# Patient Record
Sex: Male | Born: 1990 | Race: White | Hispanic: No | Marital: Married | Smoking: Never smoker
Health system: Southern US, Community
[De-identification: ages and names within clinical notes are randomized; demographics above are authoritative.]

## PROBLEM LIST (undated history)

## (undated) DIAGNOSIS — F909 Attention-deficit hyperactivity disorder, unspecified type: Secondary | ICD-10-CM

---

## 2021-01-02 ENCOUNTER — Emergency Department (HOSPITAL_BASED_OUTPATIENT_CLINIC_OR_DEPARTMENT_OTHER): Payer: BLUE CROSS/BLUE SHIELD

## 2021-01-02 ENCOUNTER — Emergency Department (HOSPITAL_BASED_OUTPATIENT_CLINIC_OR_DEPARTMENT_OTHER)
Admission: EM | Admit: 2021-01-02 | Discharge: 2021-01-02 | Disposition: A | Discharge status: Home / Self Care | Payer: BLUE CROSS/BLUE SHIELD | Attending: Emergency Medicine | Admitting: Emergency Medicine

## 2021-01-02 ENCOUNTER — Encounter (HOSPITAL_BASED_OUTPATIENT_CLINIC_OR_DEPARTMENT_OTHER): Payer: Self-pay | Admitting: *Deleted

## 2021-01-02 ENCOUNTER — Other Ambulatory Visit: Payer: Self-pay

## 2021-01-02 DIAGNOSIS — R202 Paresthesia of skin: Secondary | ICD-10-CM | POA: Diagnosis not present

## 2021-01-02 DIAGNOSIS — R079 Chest pain, unspecified: Secondary | ICD-10-CM | POA: Insufficient documentation

## 2021-01-02 DIAGNOSIS — M542 Cervicalgia: Secondary | ICD-10-CM | POA: Diagnosis not present

## 2021-01-02 DIAGNOSIS — R110 Nausea: Secondary | ICD-10-CM | POA: Diagnosis not present

## 2021-01-02 DIAGNOSIS — R42 Dizziness and giddiness: Secondary | ICD-10-CM | POA: Insufficient documentation

## 2021-01-02 DIAGNOSIS — R531 Weakness: Secondary | ICD-10-CM | POA: Insufficient documentation

## 2021-01-02 HISTORY — DX: Attention-deficit hyperactivity disorder, unspecified type: F90.9

## 2021-01-02 LAB — CBC
HCT: 44.4 % (ref 39.0–52.0)
Hemoglobin: 15.6 g/dL (ref 13.0–17.0)
MCH: 32.5 pg (ref 26.0–34.0)
MCHC: 35.1 g/dL (ref 30.0–36.0)
MCV: 92.5 fL (ref 80.0–100.0)
Platelets: 196 10*3/uL (ref 150–400)
RBC: 4.8 MIL/uL (ref 4.22–5.81)
RDW: 12.5 % (ref 11.5–15.5)
WBC: 7.4 10*3/uL (ref 4.0–10.5)
nRBC: 0 % (ref 0.0–0.2)

## 2021-01-02 LAB — BASIC METABOLIC PANEL
Anion gap: 9 (ref 5–15)
BUN: 9 mg/dL (ref 6–20)
CO2: 25 mmol/L (ref 22–32)
Calcium: 9.2 mg/dL (ref 8.9–10.3)
Chloride: 102 mmol/L (ref 98–111)
Creatinine, Ser: 0.85 mg/dL (ref 0.61–1.24)
GFR, Estimated: >60 mL/min (ref >60)
Glucose, Bld: 113 mg/dL — ABNORMAL HIGH (ref 70–99)
Potassium: 3.7 mmol/L (ref 3.5–5.1)
Sodium: 136 mmol/L (ref 135–145)

## 2021-01-02 LAB — TROPONIN I (HIGH SENSITIVITY): Troponin I (High Sensitivity): <2 ng/L (ref <18)

## 2021-01-02 NOTE — ED Notes (Signed)
Pt request removal of PIV, Dr Particia Nearing at bedside, discussed need to re-insert IV if intervention necessary once testing complete.

## 2021-01-02 NOTE — ED Provider Notes (Signed)
[facility] EMERGENCY DEPARTMENT Provider Note   CSN: [id number] Arrival date & time: 01/02/21  1158     History Chief Complaint  Patient presents with   Chest Pain    Levi Acosta is a 30 y.o. male.  30 year old male was at work this morning when he felt sharp shooting pain from behind his left ear radiating a few inches down his neck which resolved spontaneously and then felt a few moments of dizziness, nausea, left-sided weakness, lips tingling.  He did not lose consciousness.  Does not recall a particular event that triggered this.  About 20 minutes later he felt another electric-like shooting pain on his left leg which resolved instantaneously.  On presentation to the ED, he feels completely normal. Patient endorses drinking a 5-hour energy, red bull, multiple coffees this morning and he is also a tobacco smoker. He does not typically have a 5 hour energy but the others are normal for him. Denies similar previous episodes. Strong family history of MIs in grandfather in their 47s and 37s. His only medication is Adderall.    Past Medical History:  Diagnosis Date   ADHD     There are no problems to display for this patient.   History reviewed. No pertinent surgical history.   No family history on file.  Social History   Tobacco Use   Smoking status: Never Smoker   Smokeless tobacco: Never Used  Substance Use Topics   Alcohol use: Never   Drug use: Never    Home Medications Prior to Admission medications   Medication Sig Start Date End Date Taking? Authorizing Provider  amphetamine-dextroamphetamine (ADDERALL XR) 20 MG 24 hr capsule Take by mouth. 07/05/18  Yes [provider]    Allergies    Patient has no known allergies.  Review of Systems   Review of Systems  Constitutional: Negative for activity change, appetite change, chills, diaphoresis, fatigue and fever.  HENT: Negative for tinnitus.   Eyes: Negative for visual disturbance.   Respiratory: Negative for chest tightness and shortness of breath.   Cardiovascular: Negative for chest pain and palpitations.  Gastrointestinal: Positive for nausea. Negative for abdominal pain, diarrhea and vomiting.  Musculoskeletal: Positive for neck pain. Negative for neck stiffness.  Neurological: Positive for dizziness and weakness. Negative for syncope, speech difficulty and numbness.    Physical Exam Updated Vital Signs BP 103/78 (BP Location: Left Arm)    Pulse 60    Temp 98.3 F (36.8 C) (Oral)    Resp 18    Ht 6' (1.829 m)    Wt 88.9 kg    SpO2 100%    BMI 26.58 kg/m   Physical Exam Vitals and nursing note reviewed.  Constitutional:      General: He is not in acute distress.    Appearance: He is well-developed and normal weight. He is not ill-appearing, toxic-appearing or diaphoretic.  HENT:     Head: Normocephalic and atraumatic.  Eyes:     General: No visual field deficit.    Extraocular Movements: Extraocular movements intact.     Pupils: Pupils are equal, round, and reactive to light.  Neck:     Vascular: No JVD.  Cardiovascular:     Rate and Rhythm: Normal rate and regular rhythm.  No extrasystoles are present.    Heart sounds: Normal heart sounds. No murmur heard.   Pulmonary:     Effort: Pulmonary effort is normal. No respiratory distress.     Breath sounds: Normal breath sounds.  Musculoskeletal:     Cervical back: Normal range of motion and neck supple.  Lymphadenopathy:     Cervical: No cervical adenopathy.  Skin:    General: Skin is warm.  Neurological:     General: No focal deficit present.     Mental Status: He is alert and oriented to person, place, and time.     GCS: GCS eye subscore is 4. GCS verbal subscore is 5. GCS motor subscore is 6.     Cranial Nerves: Cranial nerves are intact. No cranial nerve deficit, dysarthria or facial asymmetry.     Sensory: Sensation is intact.     Motor: Motor function is intact. No weakness.      Coordination: Coordination is intact. Finger-Nose-Finger Test normal.     Gait: Gait is intact.     Deep Tendon Reflexes: Reflexes normal.     ED Results / Procedures / Treatments   Labs (all labs ordered are listed, but only abnormal results are displayed) Labs Reviewed  BASIC METABOLIC PANEL - Abnormal; Notable for the following components:      Result Value   Glucose, Bld 113 (*)    All other components within normal limits  CBC  TROPONIN I (HIGH SENSITIVITY)    EKG EKG Interpretation  Date/Time:  Friday January 02 2021 12:11:00 EDT Ventricular Rate:  74 PR Interval:  132 QRS Duration: 88 QT Interval:  362 QTC Calculation: 401 R Axis:   88 Text Interpretation: Normal sinus rhythm Normal ECG No old tracing to compare Confirmed by Jacalyn Lefevre [phone]) on 01/02/2021 1:00:29 PM   Radiology CT Head Wo Contrast  Result Date: 01/02/2021 CLINICAL DATA:  Transient ischemic attack (TIA). Provided: Patient reports sharp pain in left side of neck with subsequent left arm pain, nausea, left leg pain and weakness, lip numbness. EXAM: CT HEAD WITHOUT CONTRAST TECHNIQUE: Contiguous axial images were obtained from the base of the skull through the vertex without intravenous contrast. COMPARISON:  No pertinent prior exams available for comparison. FINDINGS: Brain: Cerebral volume is normal. There is no acute intracranial hemorrhage. No demarcated cortical infarct. No extra-axial fluid collection. No evidence of intracranial mass. No midline shift. Vascular: No hyperdense vessel. Skull: Normal. Negative for fracture or focal lesion. Sinuses/Orbits: Visualized orbits show no acute finding. No significant paranasal sinus disease at the imaged levels. IMPRESSION: Unremarkable non-contrast CT appearance of the brain. No evidence of acute intracranial abnormality. Electronically Signed   By: Jackey Loge DO   On: 01/02/2021 13:22    Procedures Procedures   Medications Ordered in ED Medications -  No data to display  ED Course  I have reviewed the triage vital signs and the nursing notes.  Pertinent labs & imaging results that were available during my care of the patient were reviewed by me and considered in my medical decision making (see chart for details).    MDM Rules/Calculators/A&P                         Patient had transient left sided pain and weakness associated with dizziness and nausea which had fully resolved by the time he presented to the ED.  His workup was completely unremarkable including head CT, ECG, blood work.  His neuro exam was completely normal.  Recommended patient follow up with his PCP for further evaluation and consider decreasing his caffeine and tobacco use as these could have contributed.  Final Clinical Impression(s) / ED Diagnoses Final diagnoses:  None  Rx / DC Orders ED Discharge Orders    None       Leeroy Bock, DO 01/02/21 1431    Jacalyn Lefevre, MD 01/02/21 1442

## 2021-01-02 NOTE — ED Triage Notes (Addendum)
While at work he had a sharp pain in the left side of his neck. Afterward he had left arm pain, nausea, left leg pain and weakness. He is ambulatory. Alert oriented. States his lips feel numb.

## 2021-01-02 NOTE — Discharge Instructions (Signed)
Your workup today was normal.  I would recommend you follow up with your PCP if you experience these symptoms again or be evaluated in the ED again if it occurs. Your diet could be contributing to your episode so I would suggest you decrease your caffeine and cut back on smoking.

## 2021-08-10 IMAGING — CT CT HEAD W/O CM
3 series · 15 of 47 positions shown, 18 images · non-contrast
Comparison: No pertinent prior exams available for comparison.

CLINICAL DATA: Transient ischemic attack (TIA). Provided: Patient
reports sharp pain in left side of neck with subsequent left arm
pain, nausea, left leg pain and weakness, lip numbness.

EXAM:
CT HEAD WITHOUT CONTRAST
TECHNIQUE: Contiguous axial images were obtained from the base of the skull
through the vertex without intravenous contrast.

[Series 2: head wo · axial · 0.45mm/px · z∈[-172,-36]mm · 9 of 33 slices shown, 12 images]
[im 3/33  brain]
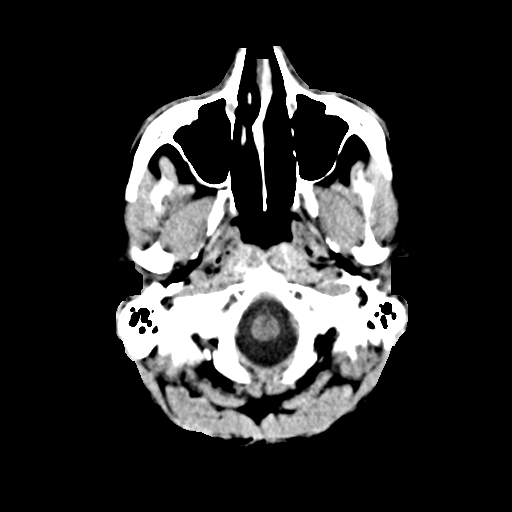
[im 3/33  bone]
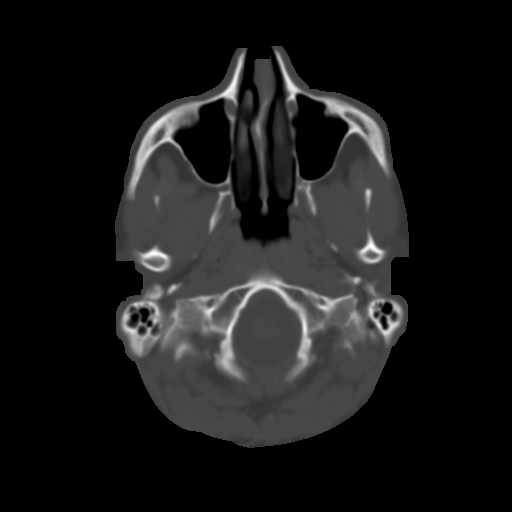
[im 6/33  brain]
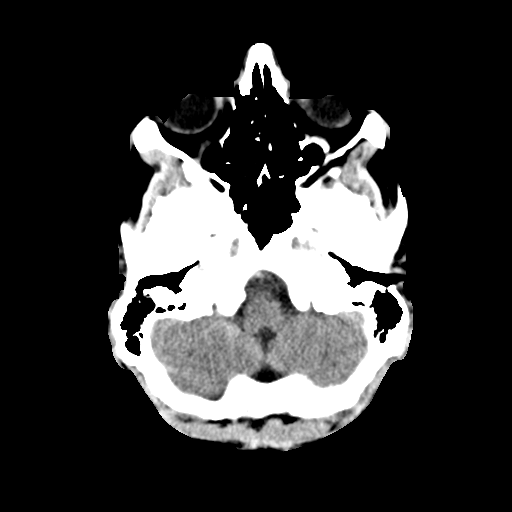
[im 9/33  brain]
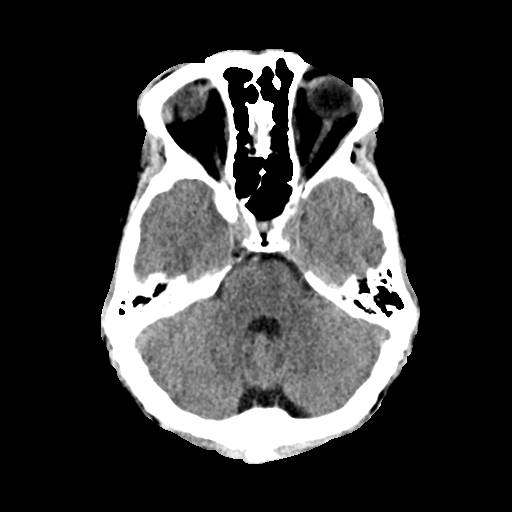
[im 13/33  brain]
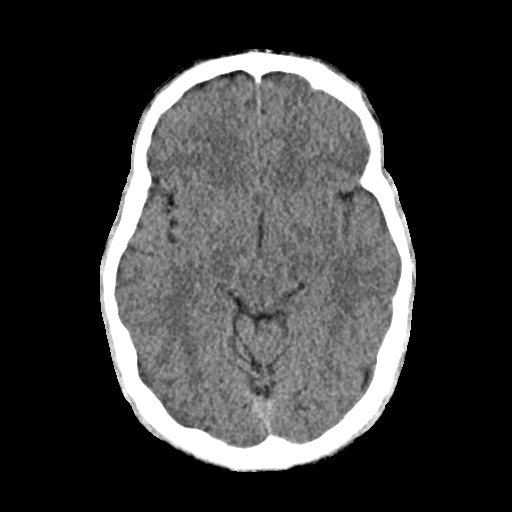
[im 17/33  brain]
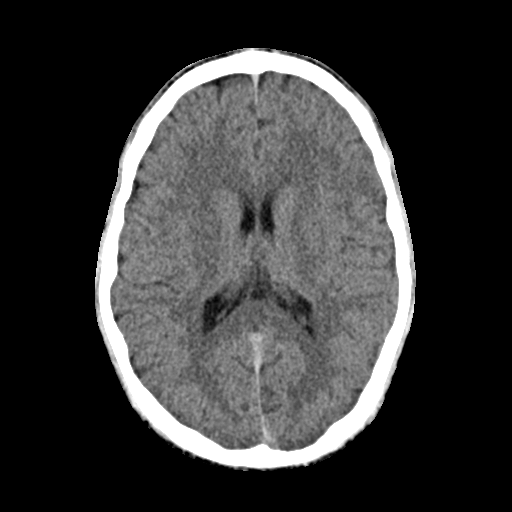
[im 17/33  bone]
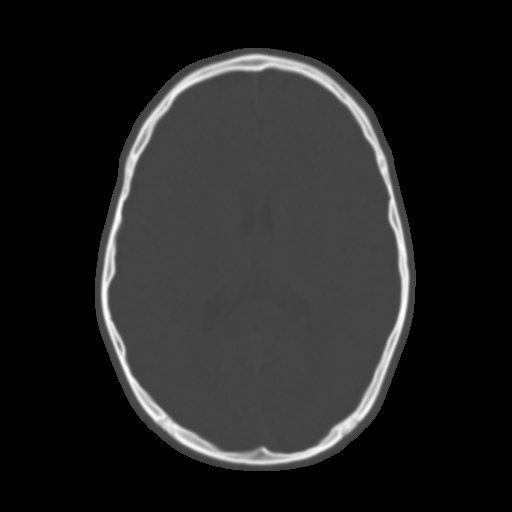
[im 20/33  brain]
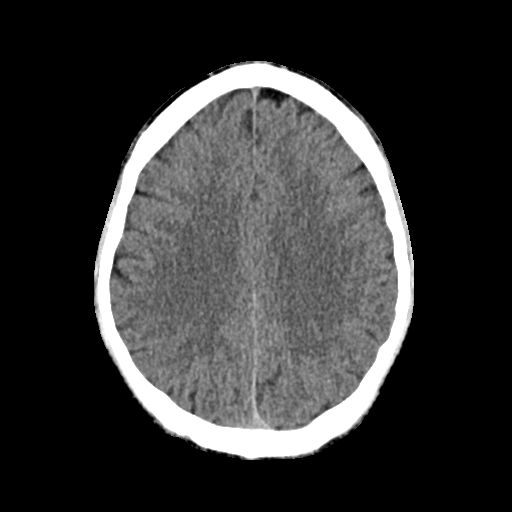
[im 24/33  brain]
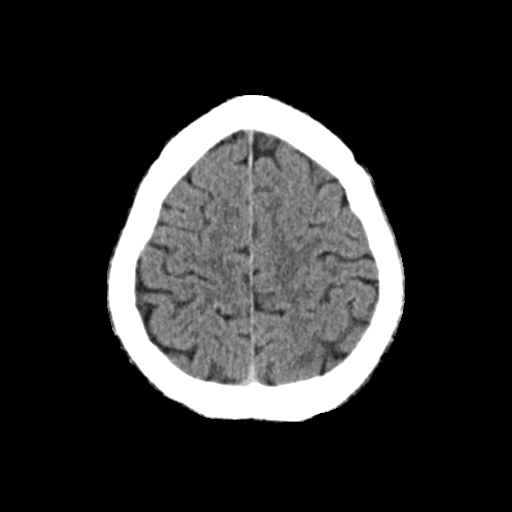
[im 27/33  brain]
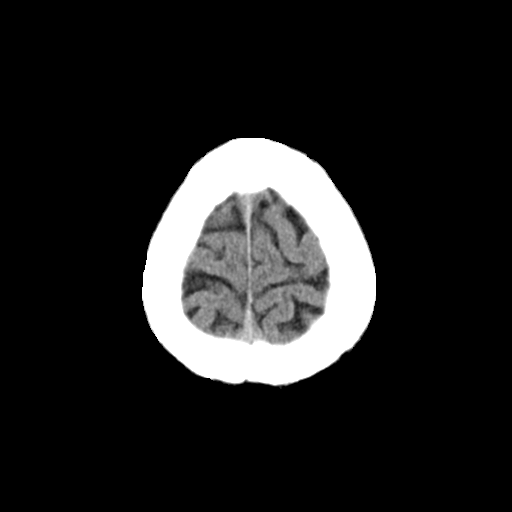
[im 30/33  brain]
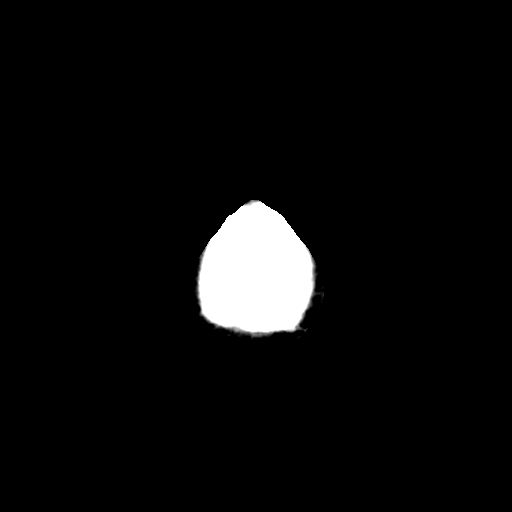
[im 30/33  bone]
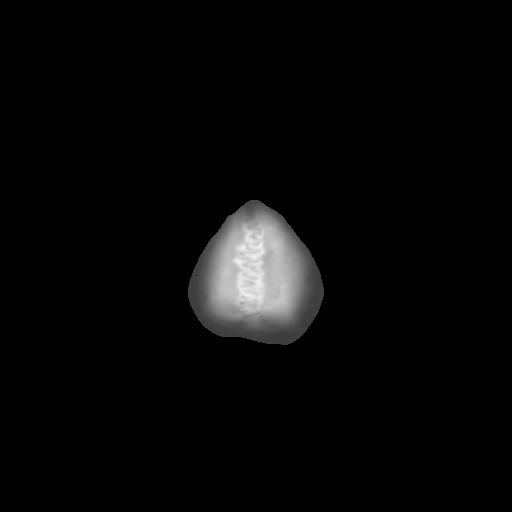

[Series 4: coronal soft · coronal · 0.34mm/px · 3 of 73 slices shown]
[im 25/73  brain]
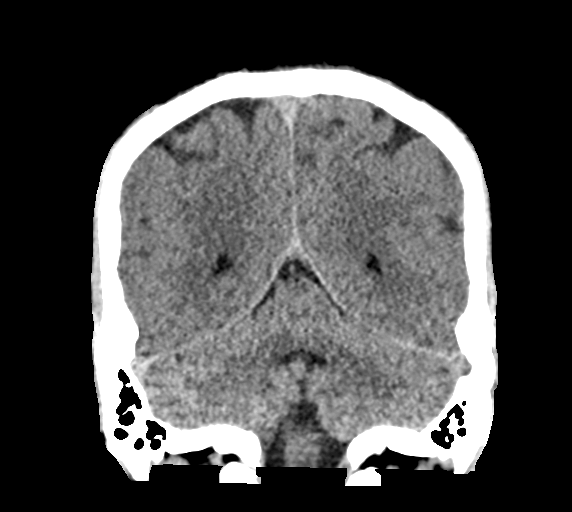
[im 33/73  brain]
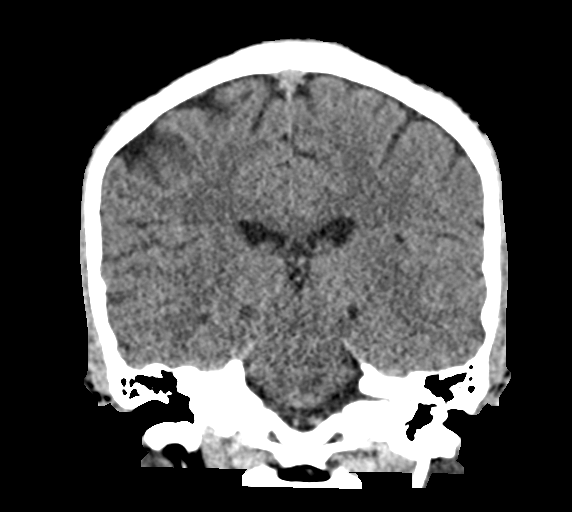
[im 41/73  brain]
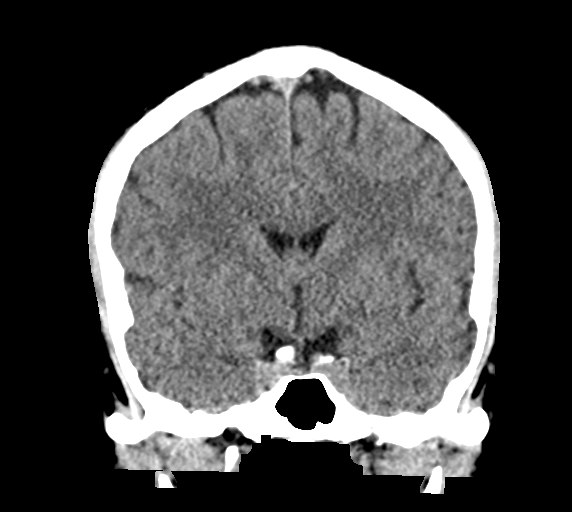

[Series 5: sag soft · sagittal · 0.34mm/px · 3 of 61 slices shown]
[im 21/61  brain]
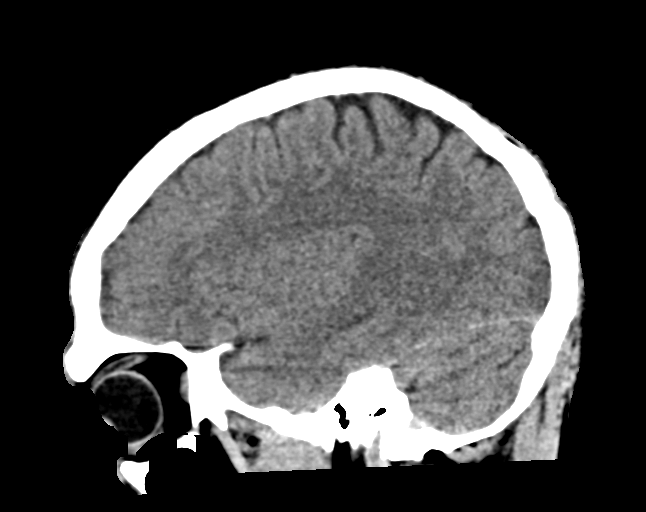
[im 31/61  brain]
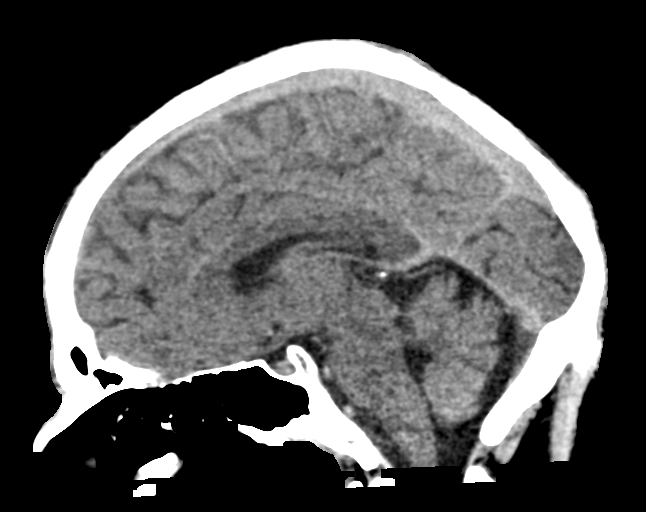
[im 41/61  brain]
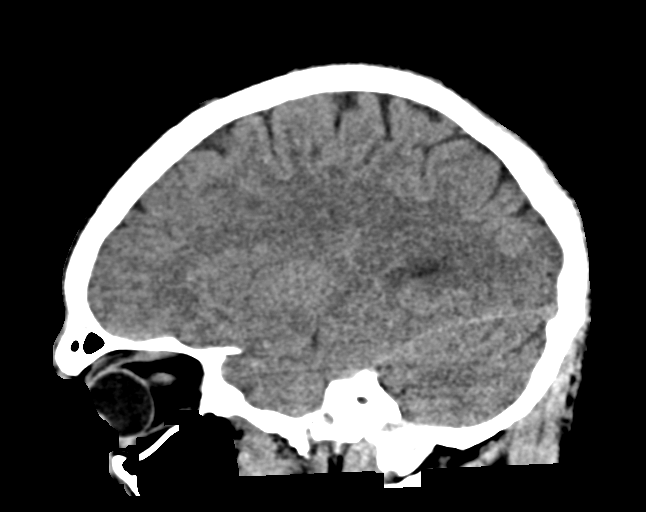

[15 of 47 positions shown; findings below may reference images not displayed]

FINDINGS: Brain:

Cerebral volume is normal.

There is no acute intracranial hemorrhage.

No demarcated cortical infarct.

No extra-axial fluid collection.

No evidence of intracranial mass.

No midline shift.

Vascular: No hyperdense vessel.

Skull: Normal. Negative for fracture or focal lesion.

Sinuses/Orbits: Visualized orbits show no acute finding. No
significant paranasal sinus disease at the imaged levels.
IMPRESSION: Unremarkable non-contrast CT appearance of the brain. No evidence of
acute intracranial abnormality.
